# Patient Record
Sex: Male | Born: 1960 | Race: White | Hispanic: No | Marital: Married | Smoking: Never smoker
Health system: Southern US, Community
[De-identification: ages and names within clinical notes are randomized; demographics above are authoritative.]

## PROBLEM LIST (undated history)

## (undated) DIAGNOSIS — E7800 Pure hypercholesterolemia, unspecified: Secondary | ICD-10-CM

## (undated) HISTORY — DX: Pure hypercholesterolemia, unspecified: E78.00

---

## 2013-12-23 ENCOUNTER — Ambulatory Visit (HOSPITAL_COMMUNITY)
Admission: RE | Admit: 2013-12-23 | Discharge: 2013-12-23 | Disposition: A | Discharge status: Home / Self Care | Payer: BC Managed Care – PPO | Source: Ambulatory Visit | Attending: Chiropractic Medicine | Admitting: Chiropractic Medicine

## 2013-12-23 ENCOUNTER — Other Ambulatory Visit (HOSPITAL_COMMUNITY): Payer: Self-pay | Admitting: Chiropractic Medicine

## 2013-12-23 DIAGNOSIS — R52 Pain, unspecified: Secondary | ICD-10-CM

## 2013-12-23 DIAGNOSIS — M7989 Other specified soft tissue disorders: Secondary | ICD-10-CM | POA: Insufficient documentation

## 2013-12-23 DIAGNOSIS — M25579 Pain in unspecified ankle and joints of unspecified foot: Secondary | ICD-10-CM | POA: Insufficient documentation

## 2019-07-23 ENCOUNTER — Other Ambulatory Visit: Payer: Self-pay | Admitting: Internal Medicine

## 2019-07-23 ENCOUNTER — Ambulatory Visit
Admission: RE | Admit: 2019-07-23 | Discharge: 2019-07-23 | Disposition: A | Discharge status: Home / Self Care | Payer: BC Managed Care – PPO | Source: Ambulatory Visit | Attending: Internal Medicine | Admitting: Internal Medicine

## 2019-07-23 DIAGNOSIS — M25552 Pain in left hip: Secondary | ICD-10-CM

## 2019-07-23 DIAGNOSIS — M5416 Radiculopathy, lumbar region: Secondary | ICD-10-CM

## 2019-07-23 DIAGNOSIS — M25551 Pain in right hip: Secondary | ICD-10-CM

## 2020-09-07 IMAGING — DX LUMBAR SPINE - 2-3 VIEW
3 series · 3 of 3 positions shown · non-contrast
Comparison: None.

CLINICAL DATA: Chronic low back pain radiates to the hips

EXAM:
LUMBAR SPINE - 2-3 VIEW

[dg lumbar spine 2-3 views (1 of 3)]
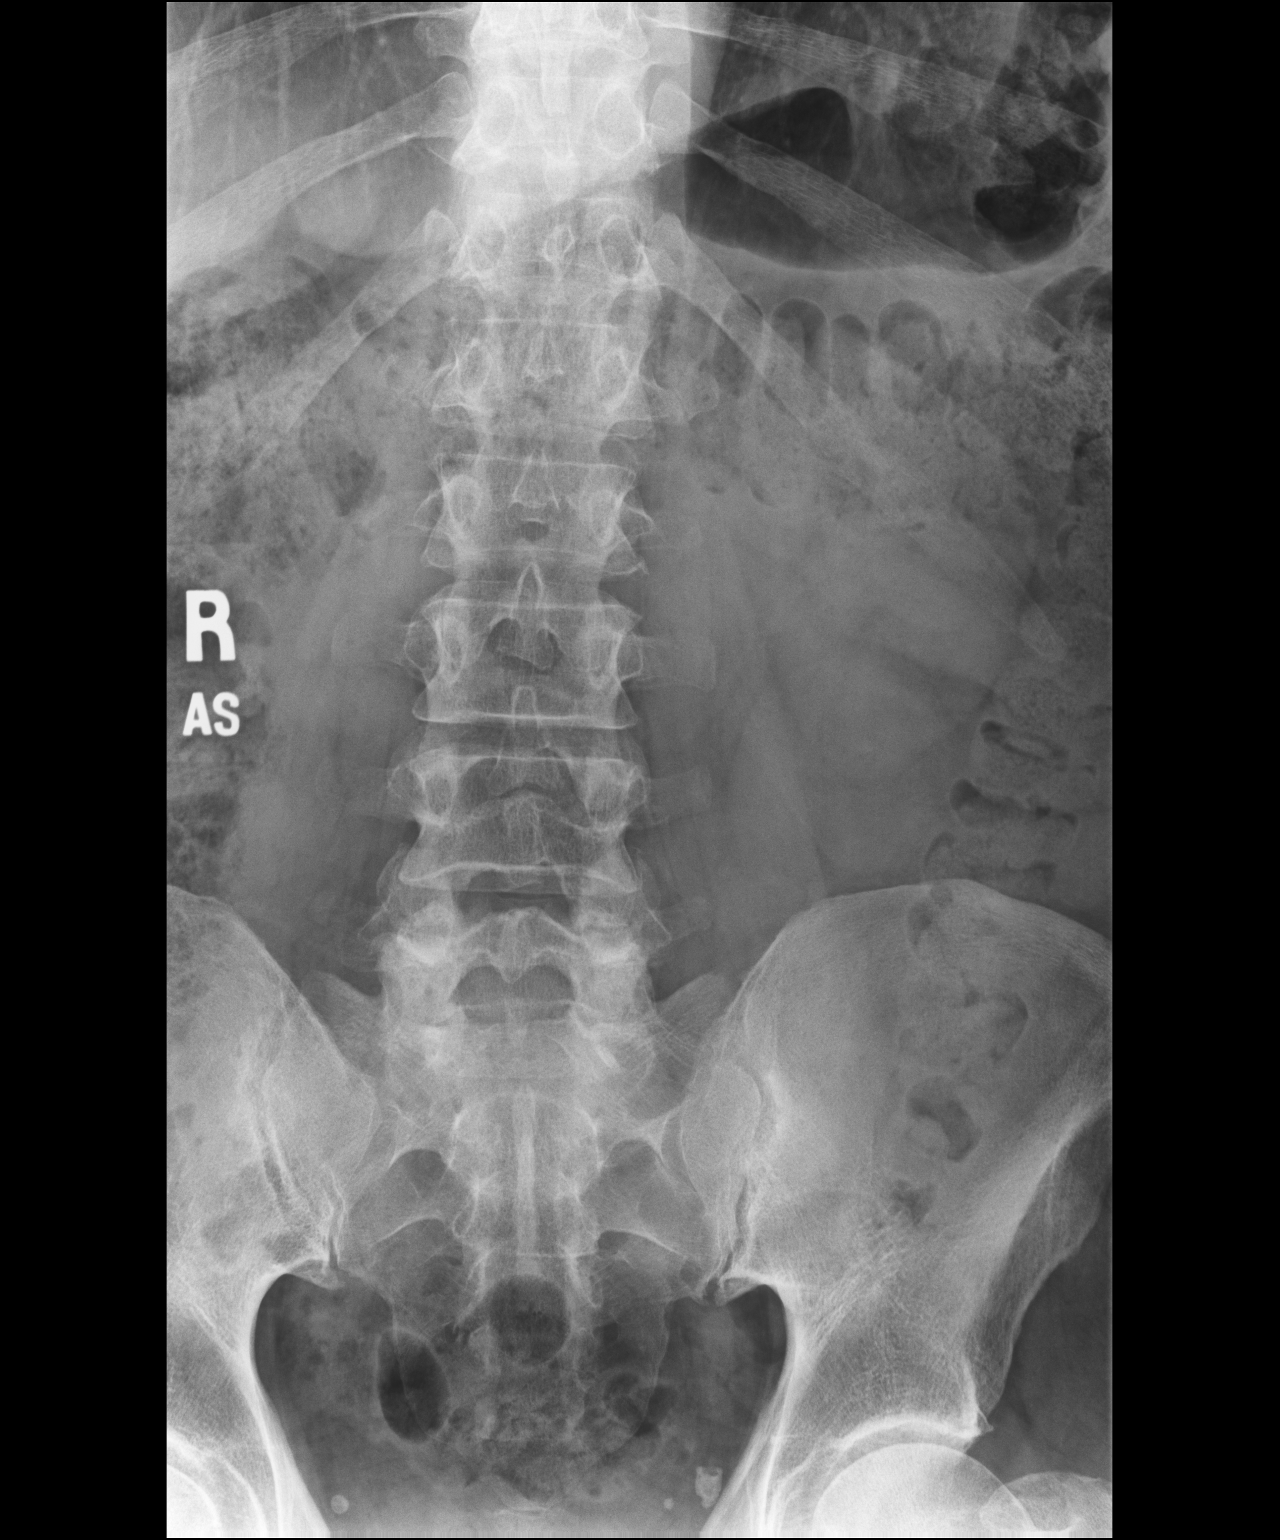

[dg lumbar spine 2-3 views (2 of 3)]
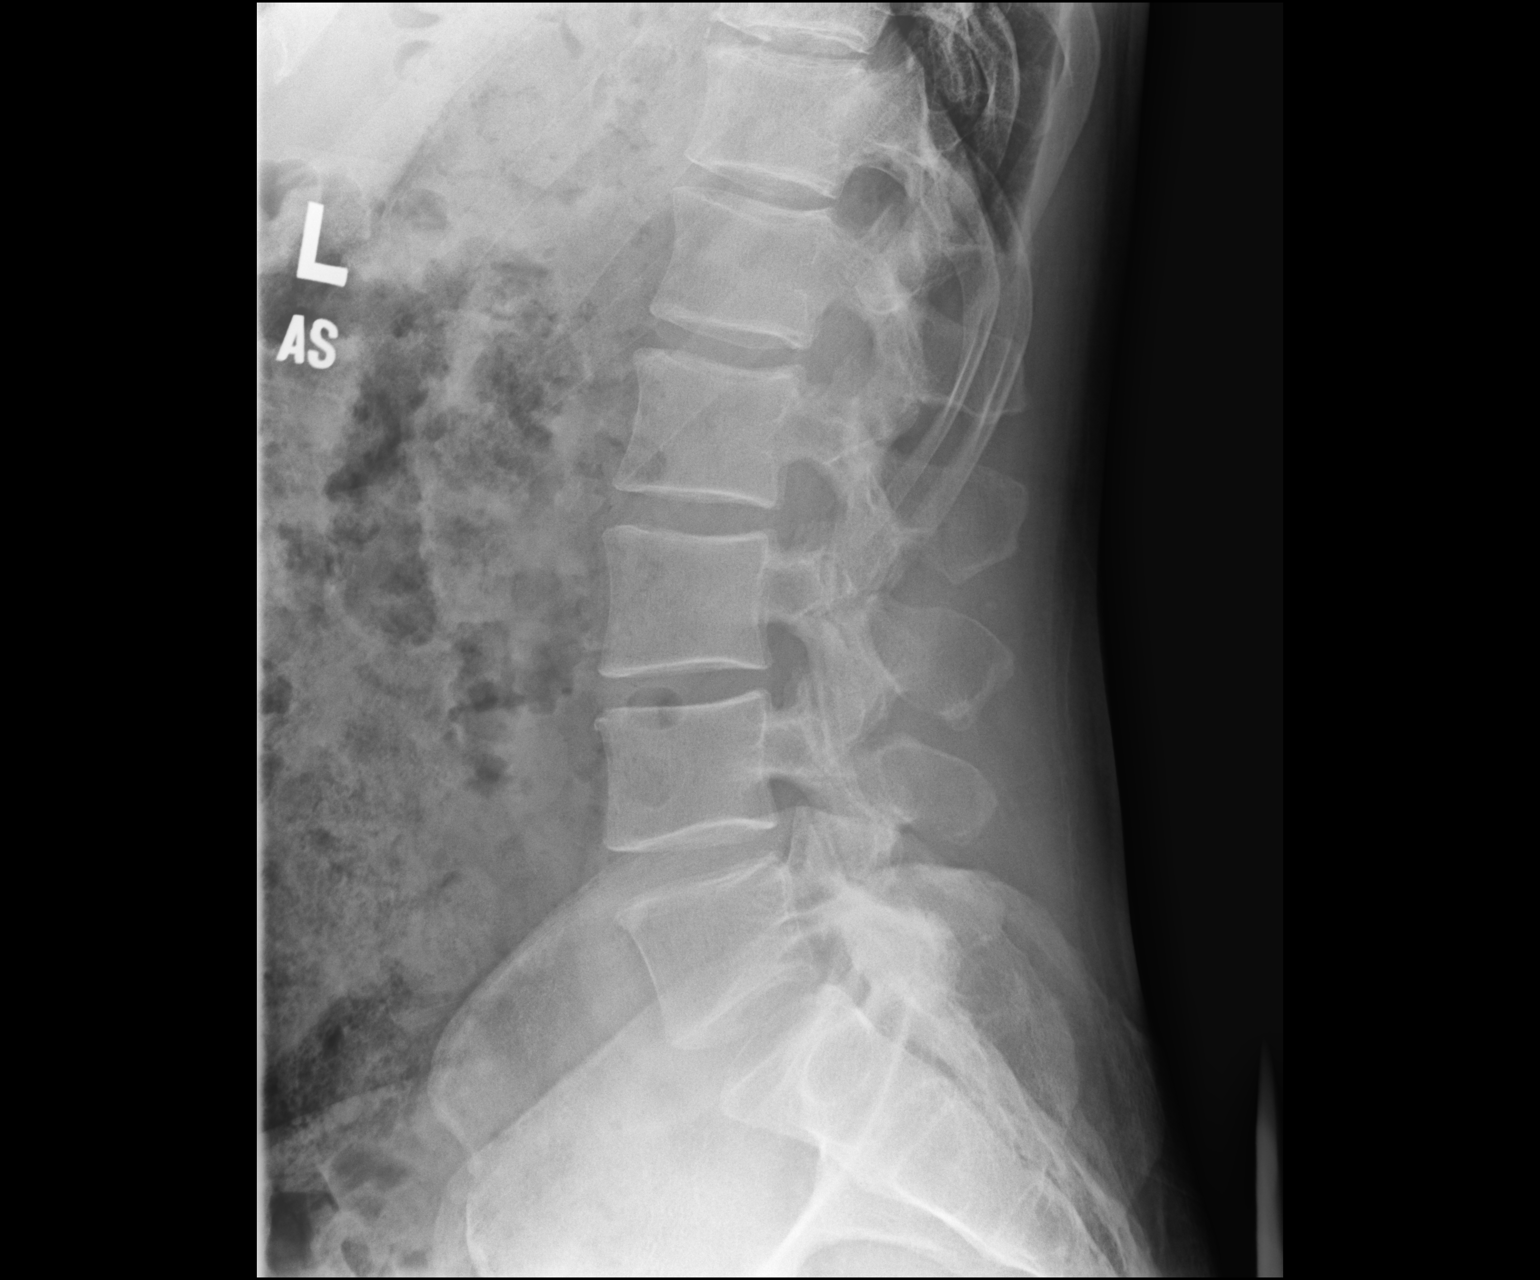

[dg lumbar spine 2-3 views (3 of 3)]
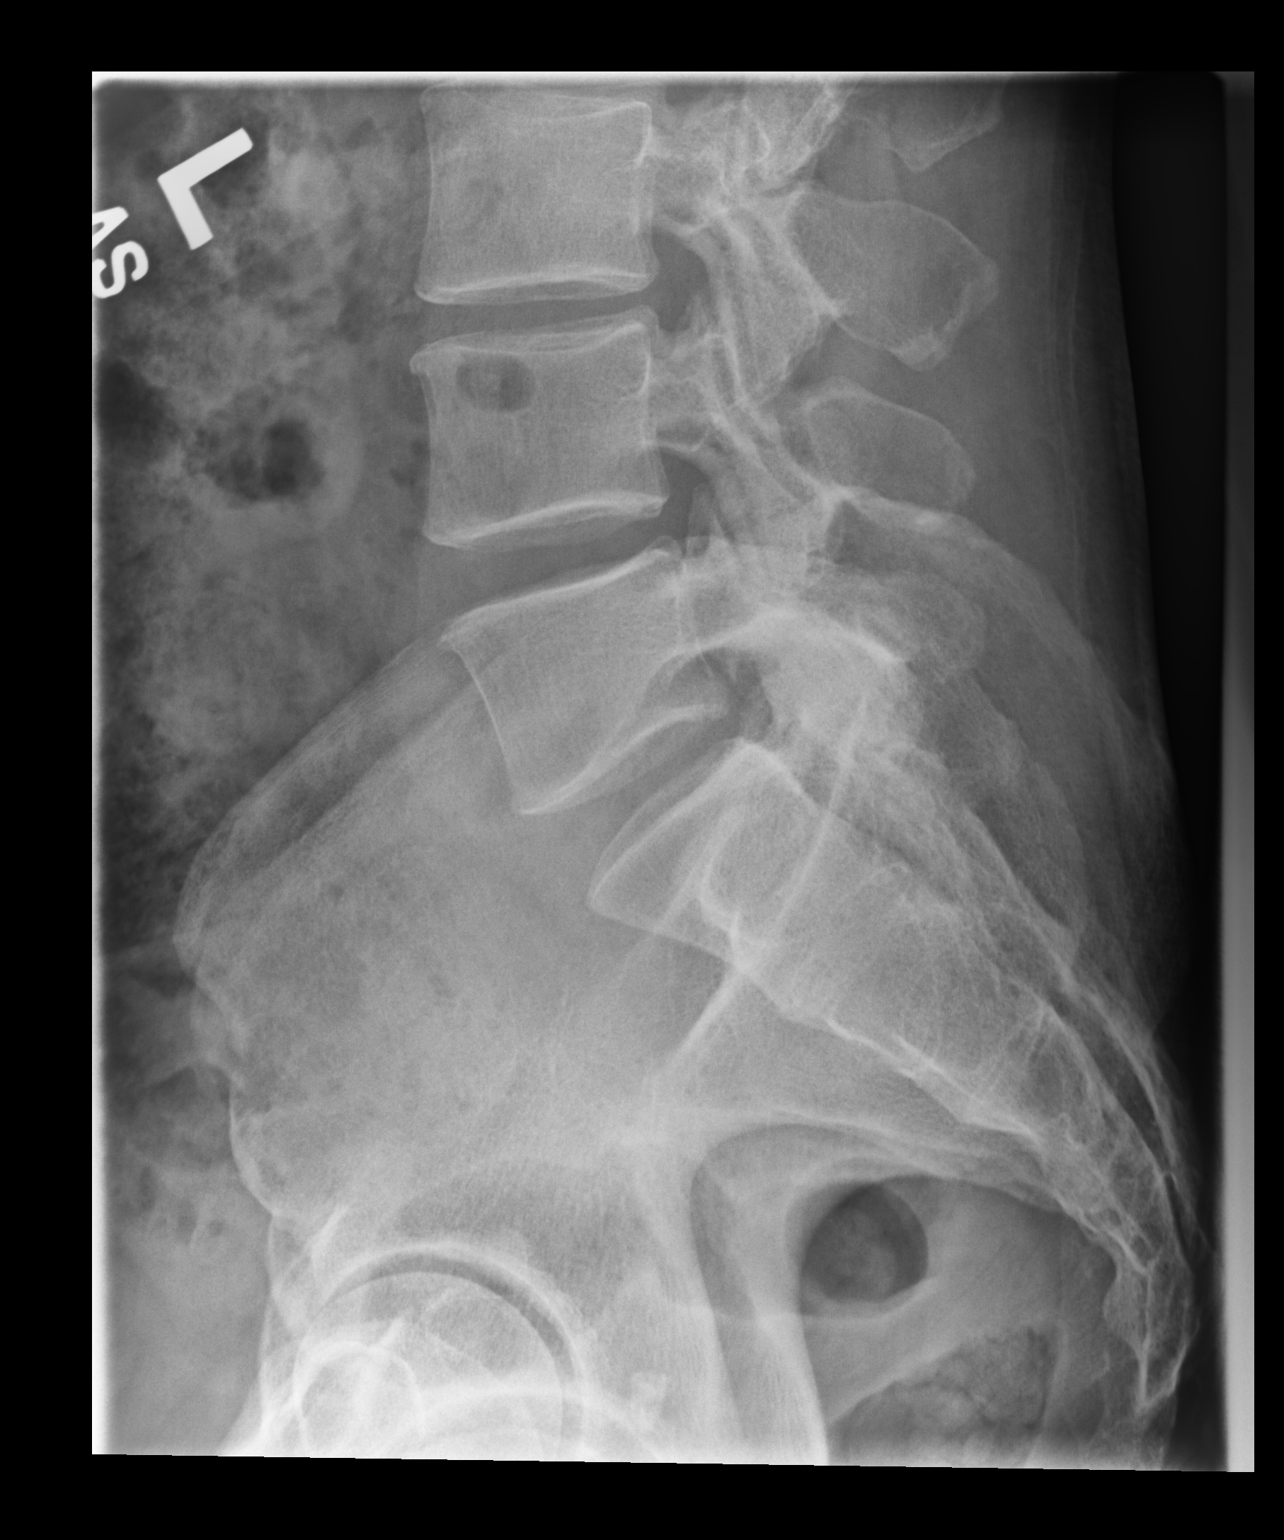

[3 of 3 positions shown; findings below may reference images not displayed]

FINDINGS: Degenerative facet disease at L4-5 and L5-S1, most pronounced at
L5-S1. Disc spaces are maintained. Normal alignment. No fracture. SI
joints symmetric and unremarkable.
IMPRESSION: Degenerative facet disease in the lower lumbar spine. No acute bony
abnormality.

## 2021-01-04 ENCOUNTER — Ambulatory Visit: Payer: BC Managed Care – PPO | Admitting: Neurology

## 2021-01-17 ENCOUNTER — Ambulatory Visit: Payer: BC Managed Care – PPO | Admitting: Neurology

## 2021-01-17 ENCOUNTER — Encounter: Payer: Self-pay | Admitting: Neurology

## 2021-01-17 DIAGNOSIS — R253 Fasciculation: Secondary | ICD-10-CM | POA: Diagnosis not present

## 2021-01-17 NOTE — Progress Notes (Signed)
Reason for visit: Muscle twitches  Referring physician: Dr. Candace Cruise Gude is a 60 y.o. male  History of present illness:  Mr. Mcewen is a 60 year old right-handed white male with a history of muscle twitches that have been present for about 2.5 months.  The patient has noted migratory muscle twitches that may occur almost anywhere on the body, including the shoulder or arm or hand or leg.  The patient sometimes will have a sensation that a twitch is going to occur prior to the actual event.  He is somewhat worse in the morning, better as the day goes on.  At times, he may have episodes where his index finger may flex down or have a tremor.  He reports no numbness on the extremities.  He has not noted any definite weakness per se.  He plays the cello on a regular basis and indicates that if he has not played for a couple weeks he seems to have more troubles with his hands.  The patient denies any balance issues or falls.  He denies issues controlling the bowels or the bladder.  He does have some dizziness on occasion with standing.  He has in the past had some right lower back and hip pain that was improved significant with physical therapy.  He denies any significant neck pain.  He reports his father has a history of Parkinson's disease but no other family history of tremors is noted.  He has dyslipidemia and takes red yeast rice for this but reports no muscle soreness or true muscle cramps.  He has had recent blood work done that does not show any electrolyte abnormalities, CK enzyme levels were normal.  Past Medical History:  Diagnosis Date  . Hypercholesteremia     History reviewed. No pertinent surgical history.  Family History  Problem Relation Age of Onset  . Congestive Heart Failure Mother   . Parkinson's disease Father   . Atrial fibrillation Father     Social history:  reports that he has never smoked. He has never used smokeless tobacco. He reports current drug  use. Drug: Other-see comments. He reports that he does not drink alcohol.  Medications:  Prior to Admission medications   Medication Sig Start Date End Date Taking? Authorizing Provider  magnesium 30 MG tablet Take 30 mg by mouth daily.   Yes [provider]  propranolol (INDERAL) 10 MG tablet Take 10 mg by mouth 3 (three) times daily.   Yes [provider]  Red Yeast Rice 600 MG TABS Take by mouth.   Yes [provider]  UNABLE TO FIND CBD   Yes [provider]     No Known Allergies  ROS:  Out of a complete 14 system review of symptoms, the patient complains only of the following symptoms, and all other reviewed systems are negative.  Muscle twitches  Blood pressure 139/86, pulse 75, height 5' 11.5" (1.816 m), weight 204 lb 6.4 oz (92.7 kg).  Physical Exam  General: The patient is alert and cooperative at the time of the examination.  Eyes: Pupils are equal, round, and reactive to light. Discs are flat bilaterally.  Neck: The neck is supple, no carotid bruits are noted.  Respiratory: The respiratory examination is clear.  Cardiovascular: The cardiovascular examination reveals a regular rate and rhythm, no obvious murmurs or rubs are noted.  Skin: Extremities are without significant edema.  Neurologic Exam  Mental status: The patient is alert and oriented x 3 at  the time of the examination. The patient has apparent normal recent and remote memory, with an apparently normal attention span and concentration ability.  Cranial nerves: Facial symmetry is present. There is good sensation of the face to pinprick and soft touch bilaterally. The strength of the facial muscles and the muscles to head turning and shoulder shrug are normal bilaterally. Speech is well enunciated, no aphasia or dysarthria is noted. Extraocular movements are full. Visual fields are full. The tongue is midline, and the patient has symmetric elevation of the soft palate. No  obvious hearing deficits are noted.  Motor: The motor testing reveals 5 over 5 strength of all 4 extremities. Good symmetric motor tone is noted throughout.  No muscle fasciculations or twitches were noted during the clinical examination.  Sensory: Sensory testing is intact to pinprick, soft touch, vibration sensation, and position sense on all 4 extremities. No evidence of extinction is noted.  Coordination: Cerebellar testing reveals good finger-nose-finger and heel-to-shin bilaterally.  Gait and station: Gait is normal. Tandem gait is normal. Romberg is negative. No drift is seen.  Reflexes: Deep tendon reflexes are symmetric and normal bilaterally. Toes are downgoing bilaterally.   Assessment/Plan:  1.  Muscle twitches, benign  The clinical examination is relatively normal.  The patient has full strength and no evidence of muscle atrophy.  The patient reports that he was drinking 3 to 4 cups of coffee a day and has within the last 5 days reduced this to 1 to 2 cups a day.  He has also taken propranolol 10 mg tablets which he has noted has been greatly helpful.  He has felt much better recently with the caffeine reduction.  This certainly could be exacerbator for his symptoms.  I do not believe the patient has any malignant issue such as ALS causing his muscle twitches.  We will follow-up in 4 months, if the exam remains benign, we will see him as needed.  Marlan Palau MD 01/17/2021 10:10 AM  [facility] [address] [place], [place] [address]  Phone [phone] Fax [phone]

## 2021-03-14 ENCOUNTER — Ambulatory Visit: Payer: BC Managed Care – PPO | Admitting: Neurology

## 2021-05-08 ENCOUNTER — Telehealth: Payer: Self-pay | Admitting: Neurology

## 2021-05-08 NOTE — Telephone Encounter (Signed)
Pt called to cancel 06/06/21 appt. Pt wanted to reschedule with Dr. Anne Hahn, but there is not an appt available.  Aundra Millet gave [place] list of diagnosis and physician to schedule  patients with. I did not see on the list to his diagnosis.

## 2021-05-08 NOTE — Telephone Encounter (Signed)
Scheduled pt with Dr. Frances Furbish.

## 2021-06-06 ENCOUNTER — Ambulatory Visit: Payer: BC Managed Care – PPO | Admitting: Neurology

## 2021-07-20 ENCOUNTER — Encounter: Payer: Self-pay | Admitting: Neurology

## 2021-07-20 ENCOUNTER — Ambulatory Visit: Payer: BC Managed Care – PPO | Admitting: Neurology

## 2021-07-20 VITALS — BP 121/82 | HR 70 | Ht 71.5 in | Wt 199.2 lb

## 2021-07-20 DIAGNOSIS — R253 Fasciculation: Secondary | ICD-10-CM

## 2021-07-20 DIAGNOSIS — G253 Myoclonus: Secondary | ICD-10-CM | POA: Diagnosis not present

## 2021-07-20 NOTE — Progress Notes (Signed)
Subjective:    Patient ID: John Mahoney is a 60 y.o. male.  HPI    Interim history:   John Mahoney is a 60 year old right-handed gentleman with a past medical history of hyperlipidemia, who presents for follow-up consultation in this clinic for muscle twitching.  He had originally seen Dr. Anne Hahn on 01/17/2021 for an approximately 2-1/16-month history of muscle twitching.  These were widespread throughout the body, no telltale trigger.  He denied any sudden onset of symptoms, had occasional dizziness upon standing.  I reviewed Dr. Anne Hahn' office note from 01/17/2021.  He had reportedly had recent blood work which showed no electrolyte disturbances, CK levels were normal.  Examination was benign, no fasciculation was noted.  Patient reported that symptoms improved when he was started on propranolol and he also endorsed improvement of his twitching after he reduced his caffeine intake from 3 to 4 cups of coffee per day to 1 or 2 cups of coffee per day.   Today, 07/20/2021: He reports feeling more or less stable.  During this summer he had about a 3-week period during which he had no symptoms.  He describes recurrence of some twitching, minor muscle trembling in his fingers.  He has not had any major weakness, no numbness or tingling, no pain.  He has not fallen, no difficulty with coordination.  His father has Parkinson's disease.  He reports no new symptoms essentially.  He has started taking magnesium.  He has been taking over-the-counter vitamin D.  He restarted taking Crestor a couple months ago.  He stopped taking the red rice yeast.  He has eliminated caffeine.  He does not drink alcohol.  He is a Adult nurse.  Sometimes he has a jerking like movements and or larger muscle groups such as the right shoulder area.  It feels like the muscle jerking when he first fall asleep (ie sleep starts).  The patient's allergies, current medications, family history, past medical history, past social history, past  surgical history and problem list were reviewed and updated as appropriate.   Previously (copied from previous notes for reference):   01/17/21: Dr. Anne Hahn: "John Mahoney is a 59 year old right-handed white male with a history of muscle twitches that have been present for about 2.5 months.  The patient has noted migratory muscle twitches that may occur almost anywhere on the body, including the shoulder or arm or hand or leg.  The patient sometimes will have a sensation that a twitch is going to occur prior to the actual event.  He is somewhat worse in the morning, better as the day goes on.  At times, he may have episodes where his index finger may flex down or have a tremor.  He reports no numbness on the extremities.  He has not noted any definite weakness per se.  He plays the cello on a regular basis and indicates that if he has not played for a couple weeks he seems to have more troubles with his hands.  The patient denies any balance issues or falls.  He denies issues controlling the bowels or the bladder.  He does have some dizziness on occasion with standing.  He has in the past had some right lower back and hip pain that was improved significant with physical therapy.  He denies any significant neck pain.  He reports his father has a history of Parkinson's disease but no other family history of tremors is noted.  He has dyslipidemia and takes red yeast rice for this but reports  no muscle soreness or true muscle cramps.  He has had recent blood work done that does not show any electrolyte abnormalities, CK enzyme levels were normal."  His Past Medical History Is Significant For: Past Medical History:  Diagnosis Date   Hypercholesteremia     His Past Surgical History Is Significant For: No past surgical history on file.  His Family History Is Significant For: Family History  Problem Relation Age of Onset   Congestive Heart Failure Mother    Parkinson's disease Father    Atrial  fibrillation Father     His Social History Is Significant For: Social History   Socioeconomic History   Marital status: Married    Spouse name: Not on file   Number of children: Not on file   Years of education: Not on file   Highest education level: Not on file  Occupational History    Comment: full time  Tobacco Use   Smoking status: Never   Smokeless tobacco: Never  Substance and Sexual Activity   Alcohol use: Never   Drug use: Yes    Types: Other-see comments    Comment: CBD nightly   Sexual activity: Not on file  Other Topics Concern   Not on file  Social History Narrative   Lives with wife   Right Handed   Drinks 2-3 cups caffeine daily   Social Determinants of Health   Financial Resource Strain: Not on file  Food Insecurity: Not on file  Transportation Needs: Not on file  Physical Activity: Not on file  Stress: Not on file  Social Connections: Not on file    His Allergies Are:  No Known Allergies:   His Current Medications Are:  Outpatient Encounter Medications as of 07/20/2021  Medication Sig   Diclofenac Sodium 2 % SOLN Pennsaid 20 mg/gram/actuation (2 %) topical soln in metered-dose pump  APPLY 2 PUMPS (40 MG) TO THE AFFECTED AREA BY TOPICAL ROUTE 2 TIMES PER DAY   magnesium 30 MG tablet Take 30 mg by mouth daily.   predniSONE (DELTASONE) 10 MG tablet prednisone 10 mg tablet  take 1 tab three times a day for 2 days, 1 tab twice a day for 5 days, 1 tab daily till finished   propranolol (INDERAL) 10 MG tablet Take 10 mg by mouth 3 (three) times daily.   Red Yeast Rice 600 MG TABS Take by mouth.   rosuvastatin (CRESTOR) 10 MG tablet Take 10 mg by mouth daily.   UNABLE TO FIND CBD   ketoconazole (NIZORAL) 2 % cream ketoconazole 2 % topical cream (Patient not taking: Reported on 07/20/2021)   triamcinolone cream (KENALOG) 0.1 % triamcinolone acetonide 0.1 % topical cream (Patient not taking: Reported on 07/20/2021)   No facility-administered encounter  medications on file as of 07/20/2021.  :  Review of Systems:  Out of a complete 14 point review of systems, all are reviewed and negative with the exception of these symptoms as listed below:   Review of Systems  Neurological:        Fasiculations no change.    Objective:  Neurological Exam  Physical Exam Physical Examination:   Vitals:   07/20/21 1026  BP: 121/82  Pulse: 70    General Examination: The patient is a very pleasant 60 y.o. male in no acute distress. He appears well-developed and well-nourished and well groomed.   HEENT: Normocephalic, atraumatic, pupils are equal, round and reactive to light, extraocular tracking is well-preserved, no nystagmus.  Hearing is grossly intact.  Face is symmetric with normal facial animation and normal facial sensation.  No lip, neck or jaw tremor, neck is supple with full range of motion actively and passively, no carotid bruits.  Airway examination reveals no tongue fasciculations, tongue protrudes centrally and palate elevates symmetrically.   Chest: Clear to auscultation without wheezing, rhonchi or crackles noted.  Heart: S1+S2+0, regular and normal without murmurs, rubs or gallops noted.   Abdomen: Soft, non-tender and non-distended with normal bowel sounds appreciated on auscultation.  Extremities: There is no pitting edema in the distal lower extremities bilaterally. Pedal pulses are intact.  Skin: Warm and dry without trophic changes noted. There are no varicose veins.  Musculoskeletal: exam reveals no obvious joint deformities, tenderness or joint swelling or erythema.   Neurologically:  Mental status: The patient is awake, alert and oriented in all 4 spheres. His immediate and remote memory, attention, language skills and fund of knowledge are appropriate. There is no evidence of aphasia, agnosia, apraxia or anomia. Speech is clear with normal prosody and enunciation. Thought process is linear. Mood is normal and affect is  normal.  Cranial nerves II - XII are as described above under HEENT exam. In addition: shoulder shrug is normal with equal shoulder height noted. Motor exam: Normal bulk, strength and tone is noted. There is no drift, tremor or rebound, with the exception of a mild trembling noted in his fingers with his palms turned up.  No obvious postural or action tremor, no intention tremor, no resting tremor.   No focal or generalized atrophy, no fasciculations noted, no myoclonus, no athetoid movements.   Romberg is negative. Reflexes are 1+ throughout. Babinski: Toes are flexor bilaterally. Fine motor skills and coordination: intact with normal finger taps, normal hand movements, normal rapid alternating patting, normal foot taps and normal foot agility.  Cerebellar testing: No dysmetria or intention tremor on finger to nose testing. Heel to shin is unremarkable bilaterally. There is no truncal or gait ataxia.  Sensory exam: intact to light touch, vibration, and temperature sense in the upper and lower extremities.  Gait, station and balance: He stands easily. No veering to one side is noted. No leaning to one side is noted. Posture is age-appropriate and stance is narrow based. Gait shows normal stride length and normal pace. No problems turning are noted. Tandem walk is unremarkable.   Assessment and Plan:   In summary, John Mahoney is a very pleasant 60 y.o.-year old male with a past medical history of hyperlipidemia, who presents for follow-up consultation in this clinic for muscle twitching.  He saw Dr. Anne Hahn some 6 months ago and presents for recheck.  Symptoms are more or less stable, in fact, he may have had some improvement after eliminating caffeine.  He also had an approximately 3-week period during which time he did not have any symptoms at all.  Exam continues to be benign.  In particular, he has preserved strength, no obvious focal or global muscle atrophy, no obvious fasciculations or  twitching/myoclonus noted today.  He is reassured that there is no obvious signs of parkinsonism as he reports a family history of Parkinson's disease.  I suggested we go ahead with some repeat blood work.  We will check vitamin B12, vitamin D, TSH, inflammatory markers and CK level.  We will call him with his results.  I do not see a reason to pursue electrophysiological nerve and muscle testing.  He is agreeable with this approach.  So long as his blood tests are  benign, we can see him back in this clinic on an as-needed basis.  He was offered a routine follow-up in 6 months for a recheck but prefers to monitor his symptoms and follow-up as needed.  I answered all his questions today and he was in agreement with the above plan.    Sincerely,   Huston Foley, MD, PhD

## 2021-07-20 NOTE — Patient Instructions (Signed)
It was nice to meet you today.  Your exam appears to be stable, overall benign.  Strength in particular and reflexes are good, no muscle atrophy as in muscle volume loss.  No evidence of Parkinson's-like changes, thankfully. We will do some blood work today and call you with the results.  So long as your symptoms are stable and your blood work is benign, we can see you back in this clinic on an as-needed basis.

## 2021-07-24 LAB — CBC WITH DIFFERENTIAL/PLATELET
Basophils Absolute: 0 10*3/uL (ref 0.0–0.2)
Basos: 0 %
EOS (ABSOLUTE): 0 10*3/uL (ref 0.0–0.4)
Eos: 0 %
Hematocrit: 47.4 % (ref 37.5–51.0)
Hemoglobin: 16.4 g/dL (ref 13.0–17.7)
Immature Grans (Abs): 0 10*3/uL (ref 0.0–0.1)
Immature Granulocytes: 0 %
Lymphocytes Absolute: 1.1 10*3/uL (ref 0.7–3.1)
Lymphs: 9 %
MCH: 32.2 pg (ref 26.6–33.0)
MCHC: 34.6 g/dL (ref 31.5–35.7)
MCV: 93 fL (ref 79–97)
Monocytes Absolute: 0.7 10*3/uL (ref 0.1–0.9)
Monocytes: 6 %
Neutrophils Absolute: 10.1 10*3/uL — ABNORMAL HIGH (ref 1.4–7.0)
Neutrophils: 85 %
Platelets: 245 10*3/uL (ref 150–450)
RBC: 5.1 x10E6/uL (ref 4.14–5.80)
RDW: 12.5 % (ref 11.6–15.4)
WBC: 12 10*3/uL — ABNORMAL HIGH (ref 3.4–10.8)

## 2021-07-24 LAB — COMPREHENSIVE METABOLIC PANEL
ALT: 19 IU/L (ref 0–44)
AST: 14 IU/L (ref 0–40)
Albumin/Globulin Ratio: 2.7 — ABNORMAL HIGH (ref 1.2–2.2)
Albumin: 4.8 g/dL (ref 3.8–4.8)
Alkaline Phosphatase: 56 IU/L (ref 44–121)
BUN/Creatinine Ratio: 18 (ref 10–24)
BUN: 14 mg/dL (ref 8–27)
Bilirubin Total: 0.9 mg/dL (ref 0.0–1.2)
CO2: 25 mmol/L (ref 20–29)
Calcium: 10 mg/dL (ref 8.6–10.2)
Chloride: 102 mmol/L (ref 96–106)
Creatinine, Ser: 0.8 mg/dL (ref 0.76–1.27)
Globulin, Total: 1.8 g/dL (ref 1.5–4.5)
Glucose: 104 mg/dL — ABNORMAL HIGH (ref 65–99)
Potassium: 4.5 mmol/L (ref 3.5–5.2)
Sodium: 141 mmol/L (ref 134–144)
Total Protein: 6.6 g/dL (ref 6.0–8.5)
eGFR: 101 mL/min/{1.73_m2} (ref >59)

## 2021-07-24 LAB — TSH: TSH: 0.572 u[IU]/mL (ref 0.450–4.500)

## 2021-07-24 LAB — CK: Total CK: 53 U/L (ref 41–331)

## 2021-07-24 LAB — SEDIMENTATION RATE: Sed Rate: 3 mm/hr (ref 0–30)

## 2021-07-24 LAB — B12 AND FOLATE PANEL
Folate: 13 ng/mL (ref >3.0)
Vitamin B-12: 680 pg/mL (ref 232–1245)

## 2021-07-24 LAB — VITAMIN D 25 HYDROXY (VIT D DEFICIENCY, FRACTURES): Vit D, 25-Hydroxy: 40.7 ng/mL (ref 30.0–100.0)

## 2021-07-24 LAB — ZINC: Zinc: 105 ug/dL (ref 44–115)

## 2021-07-24 LAB — VITAMIN B6: Vitamin B6: 12.5 ug/L (ref 3.4–65.2)

## 2021-07-24 LAB — MAGNESIUM: Magnesium: 2.4 mg/dL — ABNORMAL HIGH (ref 1.6–2.3)

## 2021-07-24 LAB — ANA W/REFLEX: Anti Nuclear Antibody (ANA): NEGATIVE

## 2021-07-24 LAB — HGB A1C W/O EAG: Hgb A1c MFr Bld: 5.5 % (ref 4.8–5.6)

## 2021-07-26 ENCOUNTER — Telehealth: Payer: Self-pay | Admitting: *Deleted

## 2021-07-26 NOTE — Telephone Encounter (Signed)
-----   Message from Huston Foley, MD sent at 07/24/2021 12:49 PM EDT ----- Please call patient and advise him that his labs were benign with a couple of exceptions.  His white cell count was mildly elevated at 12.0, this can indicate an underlying smoldering infection or inflammation, but is a nonspecific finding.  His magnesium level was slightly above normal at 2.4.  I believe he is taking a magnesium supplement which he may be able to stop at this time.  I would recommend follow-up with primary care physician for a recheck on his white cell count in the next couple of weeks.  If he has any symptoms of infection such as a cough, sinus infection or urinary symptoms, he needs to be seen by his primary care physician.

## 2021-07-26 NOTE — Telephone Encounter (Signed)
Spoke with patient and discussed lab results as noted by Dr. Frances Furbish.  The patient verbalized understanding and he wanted to make a note he is on prednisone for an elbow injury.  He also is doing intermittent fasting.  The patient verbalized understanding of follow-up with primary care in the next few weeks for lab recheck and can also discuss stopping his magnesium supplement as his magnesium was slightly elevated.  He verbalized appreciation for the call.  Labs sent to primary care.
